# Patient Record
Sex: Male | Born: 2000 | Race: White | Hispanic: No | Marital: Single | Smoking: Never smoker
Health system: Southern US, Community
[De-identification: ages and names within clinical notes are randomized; demographics above are authoritative.]

## PROBLEM LIST (undated history)

## (undated) DIAGNOSIS — E119 Type 2 diabetes mellitus without complications: Secondary | ICD-10-CM

---
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
## 2021-11-18 ENCOUNTER — Ambulatory Visit
Admission: EM | Admit: 2021-11-18 | Discharge: 2021-11-18 | Disposition: A | Discharge status: Home / Self Care | Payer: Medicaid Other

## 2021-11-18 ENCOUNTER — Other Ambulatory Visit: Payer: Self-pay

## 2021-11-18 ENCOUNTER — Encounter: Payer: Self-pay | Admitting: Emergency Medicine

## 2021-11-18 DIAGNOSIS — S61411A Laceration without foreign body of right hand, initial encounter: Secondary | ICD-10-CM | POA: Diagnosis not present

## 2021-11-18 DIAGNOSIS — Z23 Encounter for immunization: Secondary | ICD-10-CM

## 2021-11-18 HISTORY — DX: Type 2 diabetes mellitus without complications: E11.9

## 2021-11-18 MED ORDER — TETANUS-DIPHTH-ACELL PERTUSSIS 5-2.5-18.5 LF-MCG/0.5 IM SUSY
0.5000 mL | PREFILLED_SYRINGE | Freq: Once | INTRAMUSCULAR | Status: AC
  Administered 2021-11-18: 0.5 mL via INTRAMUSCULAR

## 2021-11-18 NOTE — Discharge Instructions (Addendum)
Your laceration has been applied to close your laceration.  Please monitor for signs of infection that include increased redness, swelling, purulent drainage.  Tetanus vaccine has also been updated today.  Return if symptoms worsen or persist.

## 2021-11-18 NOTE — ED Triage Notes (Signed)
Patient states that he stabbed himself with an oyster shucker last night at work around 7:30pm.  Patient is unsure of last Tdap.

## 2021-11-18 NOTE — ED Provider Notes (Signed)
[facility]    CSN: [id number] Arrival date & time: 11/18/21  1430      History   Chief Complaint Chief Complaint  Patient presents with   Laceration    HPI Samuel Perez is a 21 y.o. male.   Patient presents for laceration to palmar surface of left hand between the first and second digit.  Patient reports that he stabbed himself with an oyster shucker last night at work at approximately 7 PM.  Patient is not sure of tetanus vaccine.  Denies any numbness or tingling.  Has full range of motion of fingers and hands.   Laceration  Past Medical History:  Diagnosis Date   Diabetes mellitus without complication (HCC)     There are no problems to display for this patient.   History reviewed. No pertinent surgical history.     Home Medications    Prior to Admission medications   Medication Sig Start Date End Date Taking? Authorizing Provider  Continuous Blood Gluc Receiver (DEXCOM G6 RECEIVER) DEVI Use as directed to monitor BGs. Brand medically necessary. 08/15/21  Yes [provider]  Continuous Blood Gluc Sensor (DEXCOM G6 SENSOR) MISC Use as directed to monitor BGs. Change ever 10 days. Brand medically necessary. 08/15/21  Yes [provider]  Continuous Blood Gluc Transmit (DEXCOM G6 TRANSMITTER) MISC Use as directed to monitor BG. Change every 3 months. Brand medically necessary. 08/15/21  Yes [provider]  insulin aspart (NOVOLOG) 100 UNIT/ML FlexPen INJECT SUBCUTANEOUSLY AS DIRECTED UP TO MAX DAILY DOSE OF 100 UNITS 11/13/20  Yes [provider]    Family History Family History  Problem Relation Age of Onset   Healthy Mother    Healthy Father     Social History Social History   Tobacco Use   Smoking status: Never   Smokeless tobacco: Never  Substance Use Topics   Alcohol use: Never   Drug use: Never     Allergies   Azithromycin and Penicillins   Review of Systems Review of Systems Per HPI  Physical  Exam Triage Vital Signs ED Triage Vitals  Enc Vitals Group     BP 11/18/21 1524 120/69     Pulse Rate 11/18/21 1524 62     Resp 11/18/21 1524 18     Temp 11/18/21 1524 98.5 F (36.9 C)     Temp Source 11/18/21 1524 Oral     SpO2 11/18/21 1524 96 %     Weight 11/18/21 1526 190 lb (86.2 kg)     Height 11/18/21 1526 5\' 9"  (1.753 m)     Head Circumference --      Peak Flow --      Pain Score 11/18/21 1526 1     Pain Loc --      Pain Edu? --      Excl. in GC? --    No data found.  Updated Vital Signs BP 120/69 (BP Location: Right Arm)    Pulse 62    Temp 98.5 F (36.9 C) (Oral)    Resp 18    Ht 5\' 9"  (1.753 m)    Wt 190 lb (86.2 kg)    SpO2 96%    BMI 28.06 kg/m   Visual Acuity Right Eye Distance:   Left Eye Distance:   Bilateral Distance:    Right Eye Near:   Left Eye Near:    Bilateral Near:     Physical Exam Constitutional:      General: He is not  in acute distress.    Appearance: Normal appearance. He is not toxic-appearing or diaphoretic.  HENT:     Head: Normocephalic and atraumatic.  Eyes:     Extraocular Movements: Extraocular movements intact.     Conjunctiva/sclera: Conjunctivae normal.  Pulmonary:     Effort: Pulmonary effort is normal.  Skin:    Findings: Laceration present.     Comments: Approximately 0.5 mm superficial laceration present to palmar surface of right hand between first and second digit.  Bleeding controlled.  Patient has full range of motion of all fingers.  Neurovascular intact.  Neurological:     General: No focal deficit present.     Mental Status: He is alert and oriented to person, place, and time. Mental status is at baseline.  Psychiatric:        Mood and Affect: Mood normal.        Behavior: Behavior normal.        Thought Content: Thought content normal.        Judgment: Judgment normal.     UC Treatments / Results  Labs (all labs ordered are listed, but only abnormal results are displayed) Labs Reviewed - No data to  display  EKG   Radiology No results found.  Procedures Laceration Repair  Date/Time: 11/18/2021 4:02 PM Performed by: Gustavus Bryant, FNP Authorized by: Gustavus Bryant, FNP   Consent:    Consent obtained:  Verbal   Consent given by:  Patient   Risks, benefits, and alternatives were discussed: yes     Risks discussed:  Infection and pain   Alternatives discussed:  No treatment Universal protocol:    Procedure explained and questions answered to patient or proxy's satisfaction: yes     Site/side marked: yes     Immediately prior to procedure, a time out was called: yes     Patient identity confirmed:  Verbally with patient and arm band Anesthesia:    Anesthesia method:  None Laceration details:    Location:  Hand   Hand location:  R palm   Length (cm):  0.5 Exploration:    Imaging outcome: foreign body not noted     Wound exploration: wound explored through full range of motion and entire depth of wound visualized     Contaminated: no   Treatment:    Area cleansed with:  Chlorhexidine   Amount of cleaning:  Standard   Debridement:  None Skin repair:    Repair method:  Tissue adhesive Approximation:    Approximation:  Close Repair type:    Repair type:  Simple Post-procedure details:    Dressing:  Open (no dressing)   Procedure completion:  Tolerated well, no immediate complications (including critical care time)  Medications Ordered in UC Medications  Tdap (BOOSTRIX) injection 0.5 mL (0.5 mLs Intramuscular Given 11/18/21 1548)    Initial Impression / Assessment and Plan / UC Course  I have reviewed the triage vital signs and the nursing notes.  Pertinent labs & imaging results that were available during my care of the patient were reviewed by me and considered in my medical decision making (see chart for details).     Laceration closed with Dermabond given timing of wound and sutures not being used for closing wound.  Discussed risk of using Dermabond given  location of wound.  Patient voiced understanding.  No signs of infection presently.  Tetanus updated today.  Patient to monitor for signs of infection.  Discussed return precautions.  Patient verbalized understanding  and was agreeable with plan. Final Clinical Impressions(s) / UC Diagnoses   Final diagnoses:  Laceration of right hand without foreign body, initial encounter     Discharge Instructions      Your laceration has been applied to close your laceration.  Please monitor for signs of infection that include increased redness, swelling, purulent drainage.  Tetanus vaccine has also been updated today.  Return if symptoms worsen or persist.    ED Prescriptions   None    PDMP not reviewed this encounter.   Gustavus Bryant, [place] 11/18/21 [phone]

## 2021-12-18 ENCOUNTER — Encounter (HOSPITAL_COMMUNITY): Payer: Self-pay

## 2021-12-18 ENCOUNTER — Other Ambulatory Visit: Payer: Self-pay

## 2021-12-18 ENCOUNTER — Encounter: Payer: Self-pay | Admitting: Emergency Medicine

## 2021-12-18 ENCOUNTER — Ambulatory Visit
Admission: EM | Admit: 2021-12-18 | Discharge: 2021-12-18 | Disposition: A | Discharge status: Home / Self Care | Payer: Medicaid Other | Attending: Internal Medicine | Admitting: Internal Medicine

## 2021-12-18 ENCOUNTER — Observation Stay (HOSPITAL_COMMUNITY)
Admission: EM | Admit: 2021-12-18 | Discharge: 2021-12-19 | Disposition: A | Discharge status: Home / Self Care | Payer: Medicaid Other | Attending: Family Medicine | Admitting: Family Medicine

## 2021-12-18 ENCOUNTER — Emergency Department (HOSPITAL_COMMUNITY): Payer: Medicaid Other

## 2021-12-18 DIAGNOSIS — Z794 Long term (current) use of insulin: Secondary | ICD-10-CM | POA: Diagnosis not present

## 2021-12-18 DIAGNOSIS — E1010 Type 1 diabetes mellitus with ketoacidosis without coma: Principal | ICD-10-CM | POA: Insufficient documentation

## 2021-12-18 DIAGNOSIS — R112 Nausea with vomiting, unspecified: Secondary | ICD-10-CM | POA: Diagnosis present

## 2021-12-18 DIAGNOSIS — R079 Chest pain, unspecified: Secondary | ICD-10-CM | POA: Diagnosis not present

## 2021-12-18 LAB — GLUCOSE, CAPILLARY
Glucose-Capillary: 457 mg/dL — ABNORMAL HIGH (ref 70–99)
Glucose-Capillary: 352 mg/dL — ABNORMAL HIGH (ref 70–99)
Glucose-Capillary: 288 mg/dL — ABNORMAL HIGH (ref 70–99)
Glucose-Capillary: 256 mg/dL — ABNORMAL HIGH (ref 70–99)
Glucose-Capillary: 207 mg/dL — ABNORMAL HIGH (ref 70–99)
Glucose-Capillary: 226 mg/dL — ABNORMAL HIGH (ref 70–99)
Glucose-Capillary: 187 mg/dL — ABNORMAL HIGH (ref 70–99)

## 2021-12-18 LAB — HEPATIC FUNCTION PANEL
ALT: 41 U/L (ref 0–44)
AST: 44 U/L — ABNORMAL HIGH (ref 15–41)
Albumin: 4.9 g/dL (ref 3.5–5.0)
Alkaline Phosphatase: 62 U/L (ref 38–126)
Bilirubin, Direct: 0.2 mg/dL (ref 0.0–0.2)
Indirect Bilirubin: 1 mg/dL — ABNORMAL HIGH (ref 0.3–0.9)
Total Bilirubin: 1.2 mg/dL (ref 0.3–1.2)
Total Protein: 8.4 g/dL — ABNORMAL HIGH (ref 6.5–8.1)

## 2021-12-18 LAB — BASIC METABOLIC PANEL
Anion gap: 21 — ABNORMAL HIGH (ref 5–15)
Anion gap: 12 (ref 5–15)
Anion gap: 6 (ref 5–15)
BUN: 26 mg/dL — ABNORMAL HIGH (ref 6–20)
BUN: 24 mg/dL — ABNORMAL HIGH (ref 6–20)
BUN: 21 mg/dL — ABNORMAL HIGH (ref 6–20)
CO2: 19 mmol/L — ABNORMAL LOW (ref 22–32)
CO2: 22 mmol/L (ref 22–32)
CO2: 24 mmol/L (ref 22–32)
Calcium: 9.9 mg/dL (ref 8.9–10.3)
Calcium: 9.6 mg/dL (ref 8.9–10.3)
Calcium: 9.1 mg/dL (ref 8.9–10.3)
Chloride: 94 mmol/L — ABNORMAL LOW (ref 98–111)
Chloride: 102 mmol/L (ref 98–111)
Chloride: 104 mmol/L (ref 98–111)
Creatinine, Ser: 1.08 mg/dL (ref 0.61–1.24)
Creatinine, Ser: 1.08 mg/dL (ref 0.61–1.24)
Creatinine, Ser: 0.72 mg/dL (ref 0.61–1.24)
GFR, Estimated: >60 mL/min (ref >60)
GFR, Estimated: >60 mL/min (ref >60)
GFR, Estimated: >60 mL/min (ref >60)
Glucose, Bld: 476 mg/dL — ABNORMAL HIGH (ref 70–99)
Glucose, Bld: 259 mg/dL — ABNORMAL HIGH (ref 70–99)
Glucose, Bld: 212 mg/dL — ABNORMAL HIGH (ref 70–99)
Potassium: 5.1 mmol/L (ref 3.5–5.1)
Potassium: 4.3 mmol/L (ref 3.5–5.1)
Potassium: 4.2 mmol/L (ref 3.5–5.1)
Sodium: 134 mmol/L — ABNORMAL LOW (ref 135–145)
Sodium: 136 mmol/L (ref 135–145)
Sodium: 134 mmol/L — ABNORMAL LOW (ref 135–145)

## 2021-12-18 LAB — CBC
HCT: 47.8 % (ref 39.0–52.0)
Hemoglobin: 15.5 g/dL (ref 13.0–17.0)
MCH: 28.1 pg (ref 26.0–34.0)
MCHC: 32.4 g/dL (ref 30.0–36.0)
MCV: 86.6 fL (ref 80.0–100.0)
Platelets: 176 10*3/uL (ref 150–400)
RBC: 5.52 MIL/uL (ref 4.22–5.81)
RDW: 13.2 % (ref 11.5–15.5)
WBC: 16.3 10*3/uL — ABNORMAL HIGH (ref 4.0–10.5)
nRBC: 0 % (ref 0.0–0.2)

## 2021-12-18 LAB — BLOOD GAS, VENOUS
Acid-base deficit: 13.7 mmol/L — ABNORMAL HIGH (ref 0.0–2.0)
Bicarbonate: 13.3 mmol/L — ABNORMAL LOW (ref 20.0–28.0)
O2 Saturation: 93.1 %
Patient temperature: 37
pCO2, Ven: 34 mmHg — ABNORMAL LOW (ref 44–60)
pH, Ven: 7.2 — ABNORMAL LOW (ref 7.25–7.43)
pO2, Ven: 72 mmHg — ABNORMAL HIGH (ref 32–45)

## 2021-12-18 LAB — TSH: TSH: 0.593 u[IU]/mL (ref 0.350–4.500)

## 2021-12-18 LAB — URINALYSIS, ROUTINE W REFLEX MICROSCOPIC
Bilirubin Urine: NEGATIVE
Glucose, UA: >=500 mg/dL — AB
Hgb urine dipstick: NEGATIVE
Ketones, ur: 80 mg/dL — AB
Leukocytes,Ua: NEGATIVE
Nitrite: NEGATIVE
Protein, ur: NEGATIVE mg/dL
Specific Gravity, Urine: 1.025 (ref 1.005–1.030)
pH: 5 (ref 5.0–8.0)

## 2021-12-18 LAB — BETA-HYDROXYBUTYRIC ACID
Beta-Hydroxybutyric Acid: 3.23 mmol/L — ABNORMAL HIGH (ref 0.05–0.27)
Beta-Hydroxybutyric Acid: 0.09 mmol/L (ref 0.05–0.27)

## 2021-12-18 LAB — MRSA NEXT GEN BY PCR, NASAL: MRSA by PCR Next Gen: NOT DETECTED

## 2021-12-18 LAB — LIPASE, BLOOD: Lipase: 25 U/L (ref 11–51)

## 2021-12-18 LAB — CBG MONITORING, ED
Glucose-Capillary: 367 mg/dL — ABNORMAL HIGH (ref 70–99)
Glucose-Capillary: 507 mg/dL (ref 70–99)

## 2021-12-18 LAB — HIV ANTIBODY (ROUTINE TESTING W REFLEX): HIV Screen 4th Generation wRfx: NONREACTIVE

## 2021-12-18 MED ORDER — OXYCODONE HCL 5 MG PO TABS: 5.0000 mg | ORAL_TABLET | ORAL | Status: DC | PRN

## 2021-12-18 MED ORDER — LACTATED RINGERS IV BOLUS
1000.0000 mL | INTRAVENOUS | Status: AC
  Administered 2021-12-18: 1000 mL via INTRAVENOUS

## 2021-12-18 MED ORDER — LACTATED RINGERS IV SOLN: INTRAVENOUS | Status: DC

## 2021-12-18 MED ORDER — CHLORHEXIDINE GLUCONATE CLOTH 2 % EX PADS
6.0000 | MEDICATED_PAD | Freq: Every day | CUTANEOUS | Status: DC
  Administered 2021-12-18: 6 via TOPICAL

## 2021-12-18 MED ORDER — ACETAMINOPHEN 325 MG PO TABS: 650.0000 mg | ORAL_TABLET | Freq: Four times a day (QID) | ORAL | Status: DC | PRN

## 2021-12-18 MED ORDER — ONDANSETRON HCL 4 MG/2ML IJ SOLN: 4.0000 mg | Freq: Four times a day (QID) | INTRAMUSCULAR | Status: DC | PRN

## 2021-12-18 MED ORDER — DEXTROSE IN LACTATED RINGERS 5 % IV SOLN: INTRAVENOUS | Status: DC

## 2021-12-18 MED ORDER — METOCLOPRAMIDE HCL 5 MG/ML IJ SOLN
10.0000 mg | Freq: Once | INTRAMUSCULAR | Status: AC
Start: 2021-12-18 — End: 2021-12-18
  Administered 2021-12-18: 10 mg via INTRAVENOUS
  Filled 2021-12-18: qty 2

## 2021-12-18 MED ORDER — ACETAMINOPHEN 650 MG RE SUPP: 650.0000 mg | Freq: Four times a day (QID) | RECTAL | Status: DC | PRN

## 2021-12-18 MED ORDER — ONDANSETRON HCL 4 MG PO TABS: 4.0000 mg | ORAL_TABLET | Freq: Four times a day (QID) | ORAL | Status: DC | PRN

## 2021-12-18 MED ORDER — DEXTROSE 50 % IV SOLN: 0.0000 mL | INTRAVENOUS | Status: DC | PRN

## 2021-12-18 MED ORDER — INSULIN REGULAR(HUMAN) IN NACL 100-0.9 UT/100ML-% IV SOLN
INTRAVENOUS | Status: DC
  Administered 2021-12-18: 13 [IU]/h via INTRAVENOUS
  Filled 2021-12-18: qty 100

## 2021-12-18 MED ORDER — LACTATED RINGERS IV BOLUS
1000.0000 mL | Freq: Once | INTRAVENOUS | Status: AC
  Administered 2021-12-18: 1000 mL via INTRAVENOUS

## 2021-12-18 MED ORDER — ENOXAPARIN SODIUM 40 MG/0.4ML IJ SOSY
40.0000 mg | PREFILLED_SYRINGE | INTRAMUSCULAR | Status: DC
  Filled 2021-12-18: qty 0.4

## 2021-12-18 MED ORDER — ORAL CARE MOUTH RINSE
15.0000 mL | Freq: Two times a day (BID) | OROMUCOSAL | Status: DC
  Administered 2021-12-19: 15 mL via OROMUCOSAL

## 2021-12-18 MED ORDER — DIPHENHYDRAMINE HCL 50 MG/ML IJ SOLN
25.0000 mg | Freq: Once | INTRAMUSCULAR | Status: AC
Start: 2021-12-18 — End: 2021-12-18
  Administered 2021-12-18: 25 mg via INTRAVENOUS
  Filled 2021-12-18: qty 1

## 2021-12-18 MED ORDER — ALUM & MAG HYDROXIDE-SIMETH 200-200-20 MG/5ML PO SUSP
30.0000 mL | Freq: Once | ORAL | Status: AC
Start: 2021-12-18 — End: 2021-12-18
  Administered 2021-12-18: 30 mL via ORAL
  Filled 2021-12-18: qty 30

## 2021-12-18 NOTE — Hospital Course (Signed)
Samuel Perez is a 21 y.o. M with T1DM on insulin pump who presented with 1 day vomiting, malaise, and epigastric pain. ? ?Yesterday when he replaced his insulin pump, he noticed the "needle looked bent".  Then overnight, he started to have recurrent vomiting, associated with new severe epigastric and chest pain, dry mouth and malaise.  He had no prior urinary irritative symptomes, fever/chils, cough, congestion, sore throat or other constitutional symptoms. ? ?In the ER, Bicarb 19, AG 21, Glucose 476.  UA with glucose and ketones.  CXR clear.   ? ? ? ?3/14: Started on IV fluids, insulin and admitted  ?

## 2021-12-18 NOTE — ED Notes (Signed)
Lab notified of add on labs.

## 2021-12-18 NOTE — Progress Notes (Signed)
Inpatient Diabetes Program Recommendations ? ?AACE/ADA: New Consensus Statement on Inpatient Glycemic Control (2015) ? ?Target Ranges:  Prepandial:   less than 140 mg/dL ?     Peak postprandial:   less than 180 mg/dL (1-2 hours) ?     Critically ill patients:  140 - 180 mg/dL  ? ?Lab Results  ?Component Value Date  ? GLUCAP 457 (H) 12/18/2021  ? ? ?Review of Glycemic Control ? ?Diabetes history: DM1 ?Outpatient Diabetes medications: Tandum insulin pump ?Current orders for Inpatient glycemic control: IV insulin per EndoTool for DKA ? ?In case of pump failure, Lantus 39 units QD ?Insulin pump: ?Basal: ?Bolus: ?Insulin/CHO ratio: 1:7 ?CF - 25 with target of 125 ? ?Inpatient Diabetes Program Recommendations   ? ?Continue with IV insulin until criteria met for discontinuation of drip. ? ?Will see pt on 3/15 to discuss his diabetes control. ? ?Continue to follow.  ? ?Thank you. ?Lorenda Peck, RD, LDN, CDE ?Inpatient Diabetes Coordinator ?[phone]  ? ? ? ? ?

## 2021-12-18 NOTE — ED Triage Notes (Signed)
Vomited twice this morning, after second time experienced severe chest pain and epigastric pain, constant, aching/burning in nature, 7/10. Restless in triage, type 1 DM. High CBG x 6 hours ?

## 2021-12-18 NOTE — ED Provider Notes (Signed)
?[facility] DEPT ?Provider Note ? ? ?CSN: [id number] ?Arrival date & time: 12/18/21  1109 ? ?  ? ?History ? ?Chief Complaint  ?Patient presents with  ? Emesis  ? Chest Pain  ? Hyperglycemia  ? ? ?Samuel Perez is a 21 y.o. male. ? ?21 yo M with a cc of nausea and vomiting.  Has had 3-4 events since this morning.  He had 1 episode of emesis and felt like his chest hurt a bit.  Another episode of emesis when arrival here to the ED.  Denies fevers or chills.  He had some cottage cheese in the middle the night which she thinks maybe made him sick. ? ? ?Emesis ?Chest Pain ?Associated symptoms: vomiting   ?Hyperglycemia ?Associated symptoms: chest pain and vomiting   ? ?  ? ?Home Medications ?Prior to Admission medications   ?Medication Sig Start Date End Date Taking? Authorizing Provider  ?GLUCAGON HCL IJ Inject 1 Dose as directed as needed (hypoglycemia).   Yes [provider]  ?insulin aspart (NOVOLOG) 100 UNIT/ML FlexPen Inject 100 Units into the skin See admin instructions. Uses in insulin pump, 100 units per day max daily dose. 11/13/20  Yes [provider]  ?Multiple Vitamin (MULTIVITAMIN) tablet Take 1 tablet by mouth daily.   Yes [provider]  ?   ? ?Allergies    ?Azithromycin and Penicillins   ? ?Review of Systems   ?Review of Systems  ?Cardiovascular:  Positive for chest pain.  ?Gastrointestinal:  Positive for vomiting.  ? ?Physical Exam ?Updated Vital Signs ?BP 139/68   Pulse 75   Temp (!) 97.5 ?F (36.4 ?C) (Oral)   Resp (!) 24   Ht 5\' 9"  (1.753 m)   Wt 86.2 kg   SpO2 100%   BMI 28.06 kg/m?  ?Physical Exam ?Vitals and nursing note reviewed.  ?Constitutional:   ?   Appearance: He is well-developed.  ?HENT:  ?   Head: Normocephalic and atraumatic.  ?Eyes:  ?   Pupils: Pupils are equal, round, and reactive to light.  ?Neck:  ?   Vascular: No JVD.  ?Cardiovascular:  ?   Rate and Rhythm: Normal rate and regular rhythm.  ?   Heart sounds: No murmur heard. ?   No friction rub. No gallop.  ?Pulmonary:  ?   Effort: No respiratory distress.  ?   Breath sounds: No wheezing.  ?Abdominal:  ?   General: There is no distension.  ?   Tenderness: There is no abdominal tenderness. There is no guarding or rebound.  ?Musculoskeletal:     ?   General: Normal range of motion.  ?   Cervical back: Normal range of motion and neck supple.  ?Skin: ?   Coloration: Skin is not pale.  ?   Findings: No rash.  ?Neurological:  ?   Mental Status: He is alert and oriented to person, place, and time.  ?Psychiatric:     ?   Behavior: Behavior normal.  ? ? ?ED Results / Procedures / Treatments   ?Labs ?(all labs ordered are listed, but only abnormal results are displayed) ?Labs Reviewed  ?CBC - Abnormal; Notable for the following components:  ?    Result Value  ? WBC 16.3 (*)   ? All other components within normal limits  ?URINALYSIS, ROUTINE W REFLEX MICROSCOPIC - Abnormal; Notable for the following components:  ? Color, Urine STRAW (*)   ? Glucose, UA >=500 (*)   ? Ketones, ur 80 (*)   ?  Bacteria, UA RARE (*)   ? All other components within normal limits  ?HEPATIC FUNCTION PANEL - Abnormal; Notable for the following components:  ? Total Protein 8.4 (*)   ? AST 44 (*)   ? Indirect Bilirubin 1.0 (*)   ? All other components within normal limits  ?BASIC METABOLIC PANEL - Abnormal; Notable for the following components:  ? Sodium 134 (*)   ? Chloride 94 (*)   ? CO2 19 (*)   ? Glucose, Bld 476 (*)   ? BUN 26 (*)   ? Anion gap 21 (*)   ? All other components within normal limits  ?CBG MONITORING, ED - Abnormal; Notable for the following components:  ? Glucose-Capillary 367 (*)   ? All other components within normal limits  ?LIPASE, BLOOD  ?BASIC METABOLIC PANEL  ?BASIC METABOLIC PANEL  ?BASIC METABOLIC PANEL  ?BETA-HYDROXYBUTYRIC ACID  ?BETA-HYDROXYBUTYRIC ACID  ?BLOOD GAS, VENOUS  ?CBG MONITORING, ED  ?CBG MONITORING, ED  ? ? ?EKG ?None ? ?Radiology ?DG Chest Port 1 View ? ?Result Date:  12/18/2021 ?CLINICAL DATA:  Chest and epigastric pain EXAM: PORTABLE CHEST 1 VIEW COMPARISON:  None. FINDINGS: Normal heart size. Normal mediastinal contour. No pneumothorax. No pleural effusion. Lungs appear clear, with no acute consolidative airspace disease and no pulmonary edema. IMPRESSION: No active disease. Electronically Signed   By: Ilona Sorrel M.D.   On: 12/18/2021 11:54   ? ?Procedures ?Procedures  ? ? ?Medications Ordered in ED ?Medications  ?lactated ringers bolus 1,000 mL (has no administration in time range)  ?insulin regular, human (MYXREDLIN) 100 units/ 100 mL infusion (has no administration in time range)  ?lactated ringers infusion (has no administration in time range)  ?dextrose 5 % in lactated ringers infusion (has no administration in time range)  ?dextrose 50 % solution 0-50 mL (has no administration in time range)  ?lactated ringers bolus 1,000 mL (1,000 mLs Intravenous New Bag/Given 12/18/21 1203)  ?diphenhydrAMINE (BENADRYL) injection 25 mg (25 mg Intravenous Given 12/18/21 1204)  ?metoCLOPramide (REGLAN) injection 10 mg (10 mg Intravenous Given 12/18/21 1204)  ?alum & mag hydroxide-simeth (MAALOX/MYLANTA) 200-200-20 MG/5ML suspension 30 mL (30 mLs Oral Given 12/18/21 1246)  ? ? ?ED Course/ Medical Decision Making/ A&P ?  ?                        ?Medical Decision Making ?Amount and/or Complexity of Data Reviewed ?Labs: ordered. ?Radiology: ordered. ? ?Risk ?OTC drugs. ?Prescription drug management. ? ? ?21 yo M with a chief complaint of nausea and vomiting.  This started last night.  He typically has very tightly controlled type I diabetic but felt like he has been having some trouble controlling his blood sugars for the past few days.  He did eat some cottage cheese last night.  He denied it being expired or left out for period of time.  We will obtain a laboratory evaluation and treat nausea reassess. ? ? ?Lab work concerning for DKA.  Mild leukocytosis.  Will start on insulin infusion.   Discussed with medicine for admission. ? ?CRITICAL CARE ?Performed by: Cecilio Asper ? ? ?Total critical care time: 35 minutes ? ?Critical care time was exclusive of separately billable procedures and treating other patients. ? ?Critical care was necessary to treat or prevent imminent or life-threatening deterioration. ? ?Critical care was time spent personally by me on the following activities: development of treatment plan with patient and/or surrogate as well as nursing, discussions  with consultants, evaluation of patient's response to treatment, examination of patient, obtaining history from patient or surrogate, ordering and performing treatments and interventions, ordering and review of laboratory studies, ordering and review of radiographic studies, pulse oximetry and re-evaluation of patient's condition. ? ?The patients results and plan were reviewed and discussed.   ?Any x-rays performed were independently reviewed by myself.  ? ?Differential diagnosis were considered with the presenting HPI. ? ?Medications  ?lactated ringers bolus 1,000 mL (has no administration in time range)  ?insulin regular, human (MYXREDLIN) 100 units/ 100 mL infusion (has no administration in time range)  ?lactated ringers infusion (has no administration in time range)  ?dextrose 5 % in lactated ringers infusion (has no administration in time range)  ?dextrose 50 % solution 0-50 mL (has no administration in time range)  ?lactated ringers bolus 1,000 mL (1,000 mLs Intravenous New Bag/Given 12/18/21 1203)  ?diphenhydrAMINE (BENADRYL) injection 25 mg (25 mg Intravenous Given 12/18/21 1204)  ?metoCLOPramide (REGLAN) injection 10 mg (10 mg Intravenous Given 12/18/21 1204)  ?alum & mag hydroxide-simeth (MAALOX/MYLANTA) 200-200-20 MG/5ML suspension 30 mL (30 mLs Oral Given 12/18/21 1246)  ? ? ?Vitals:  ? 12/18/21 1119 12/18/21 1122 12/18/21 1245 12/18/21 1300  ?BP: (!) 111/46  129/72 139/68  ?Pulse: 63  97 75  ?Resp: 14  (!) 22 (!) 24   ?Temp: (!) 97.5 ?F (36.4 ?C)     ?TempSrc: Oral     ?SpO2: 99%  93% 100%  ?Weight:  86.2 kg    ?Height:  5\' 9"  (1.753 m)    ? ? ?Final diagnoses:  ?Diabetic ketoacidosis without coma associated with type 1 diabetes mellitus ([place]

## 2021-12-18 NOTE — Assessment & Plan Note (Signed)
-   Stop insulin pump ?- Give additional 1L IVF ?- Start insulin infusion ?- BMP q4hrs ?- Check BHOB ?- Continue IV fluids ?- Check TSH and A1c  ? ? ? ?

## 2021-12-18 NOTE — ED Triage Notes (Signed)
Patient went to [facility] and was sent to the ED. ? ?Patient c/o chest pain/epigastric pain, hyperglycemia x 6 hours. Patient is on a Tandem insulin pump and has a Dexcom. ?CBG in triage- 367. ?Patient is having abdominal pain, N/v that started this AM. ? ? ?

## 2021-12-18 NOTE — ED Notes (Signed)
Upon entering Pt room with visitor. Pt was kneeled down on floor vomiting. ?

## 2021-12-18 NOTE — ED Provider Notes (Signed)
?[facility] ? ? ? ?CSN: [id number] ?Arrival date & time: 12/18/21  1026 ? ? ?  ? ?History   ?Chief Complaint ?Chief Complaint  ?Patient presents with  ? Chest Pain  ? ? ?HPI ?Samuel Perez is a 21 y.o. male.  ? ?Patient here today for evaluation of nausea, vomiting, chest pain and epigastric pain that started this morning.  He reports that pain is aching and burning in nature and he rates it a 7 out of 10 on 0-10 pain Scale.  He states that his blood sugars have been elevated the last 6 hours.  He does not report any fever.  He does have history of type 1 diabetes. ? ?The history is provided by the patient.  ?Chest Pain ?Associated symptoms: nausea and vomiting   ?Associated symptoms: no fever and no shortness of breath   ? ?Past Medical History:  ?Diagnosis Date  ? Diabetes mellitus without complication (HCC)   ? ? ?There are no problems to display for this patient. ? ? ?History reviewed. No pertinent surgical history. ? ? ? ? ?Home Medications   ? ?Prior to Admission medications   ?Medication Sig Start Date End Date Taking? Authorizing Provider  ?Continuous Blood Gluc Receiver (DEXCOM G6 RECEIVER) DEVI Use as directed to monitor BGs. Brand medically necessary. 08/15/21   [provider]  ?Continuous Blood Gluc Sensor (DEXCOM G6 SENSOR) MISC Use as directed to monitor BGs. Change ever 10 days. Brand medically necessary. 08/15/21   [provider]  ?Continuous Blood Gluc Transmit (DEXCOM G6 TRANSMITTER) MISC Use as directed to monitor BG. Change every 3 months. Brand medically necessary. 08/15/21   [provider]  ?insulin aspart (NOVOLOG) 100 UNIT/ML FlexPen INJECT SUBCUTANEOUSLY AS DIRECTED UP TO MAX DAILY DOSE OF 100 UNITS 11/13/20   [provider]  ? ? ?Family History ?Family History  ?Problem Relation Age of Onset  ? Healthy Mother   ? Healthy Father   ? ? ?Social History ?Social History  ? ?Tobacco Use  ? Smoking status: Never  ? Smokeless tobacco: Never  ?Substance Use  Topics  ? Alcohol use: Never  ? Drug use: Never  ? ? ? ?Allergies   ?Azithromycin and Penicillins ? ? ?Review of Systems ?Review of Systems  ?Constitutional:  Negative for chills and fever.  ?Eyes:  Negative for discharge and redness.  ?Respiratory:  Negative for shortness of breath.   ?Cardiovascular:  Positive for chest pain.  ?Gastrointestinal:  Positive for nausea and vomiting.  ? ? ?Physical Exam ?Triage Vital Signs ?ED Triage Vitals [12/18/21 1035]  ?Enc Vitals Group  ?   BP 127/70  ?   Pulse Rate (!) 55  ?   Resp (!) 24  ?   Temp 97.8 ?F (36.6 ?C)  ?   Temp Source Oral  ?   SpO2 99 %  ?   Weight   ?   Height   ?   Head Circumference   ?   Peak Flow   ?   Pain Score 7  ?   Pain Loc   ?   Pain Edu?   ?   Excl. in GC?   ? ?No data found. ? ?Updated Vital Signs ?BP 127/70 (BP Location: Right Arm)   Pulse (!) 55   Temp 97.8 ?F (36.6 ?C) (Oral)   Resp (!) 24   SpO2 99%  ?   ? ?Physical Exam ?Vitals and nursing note reviewed.  ?Constitutional:   ?  Appearance: Normal appearance. He is ill-appearing (appears pale, restless).  ?HENT:  ?   Head: Normocephalic and atraumatic.  ?Eyes:  ?   Conjunctiva/sclera: Conjunctivae normal.  ?Cardiovascular:  ?   Rate and Rhythm: Normal rate.  ?Pulmonary:  ?   Effort: Pulmonary effort is normal.  ?Neurological:  ?   Mental Status: He is alert.  ?Psychiatric:     ?   Mood and Affect: Mood normal.     ?   Behavior: Behavior normal.     ?   Thought Content: Thought content normal.  ? ? ? ?UC Treatments / Results  ?Labs ?(all labs ordered are listed, but only abnormal results are displayed) ?Labs Reviewed - No data to display ? ?EKG ? ? ?Radiology ?No results found. ? ?Procedures ?Procedures (including critical care time) ? ?Medications Ordered in UC ?Medications - No data to display ? ?Initial Impression / Assessment and Plan / UC Course  ?I have reviewed the triage vital signs and the nursing notes. ? ?Pertinent labs & imaging results that were available during my care of the  patient were reviewed by me and considered in my medical decision making (see chart for details). ? ?  ?Given past medical history and blood glucose today in office of 377 recommended further evaluation in the emergency room for stat labs and possibly imaging.  Patient is agreeable to same.  Father drove him to office today and will drive him POV to emergency room. ? ?Final Clinical Impressions(s) / UC Diagnoses  ? ?Final diagnoses:  ?Chest pain, unspecified type  ? ? ? ?Discharge Instructions   ? ?  ? ?Please report to ED at [facility] ? ?[address] ?[place], [place] ? ? ? ? ? ?ED Prescriptions   ?None ?  ? ?PDMP not reviewed this encounter. ?  ?Tomi Bamberger, PA-C ?12/18/21 1115 ? ?

## 2021-12-18 NOTE — H&P (Signed)
?History and Physical  ? ? ?Patient: Samuel Perez JXB:[id number] DOB: April 13, 2001 ?DOA: 12/18/2021 ?DOS: the patient was seen and examined on 12/18/2021 ?PCP: Lance Bosch, NP  ?Patient coming from: Home ? ? ? ? ? ?Chief Complaint:  ?Chief Complaint  ?Patient presents with  ? Emesis  ? Chest Pain  ? Hyperglycemia  ? ?HPI:  ?Mr. Want is a 21 y.o. M with T1DM on insulin pump who presented with 1 day vomiting, malaise, and epigastric pain. ? ?Yesterday when he replaced his insulin pump, he noticed the "needle looked bent".  Then overnight, he started to have recurrent vomiting, associated with new severe epigastric and chest pain, dry mouth and malaise.  He had no prior urinary irritative symptomes, fever/chils, cough, congestion, sore throat or other constitutional symptoms. ? ?In the ER, Bicarb 19, AG 21, Glucose 476.  UA with glucose and ketones.  CXR clear.   ? ? ? ?3/14: Started on IV fluids, insulin and admitted    ? ? ? ?Review of Systems: Review of Systems  ?Constitutional:  Positive for malaise/fatigue. Negative for chills and fever.  ?HENT:  Negative for congestion, sinus pain and sore throat.   ?Respiratory:  Negative for cough.   ?Cardiovascular:  Positive for chest pain.  ?Gastrointestinal:  Positive for abdominal pain, nausea and vomiting.  ?Genitourinary:  Negative for dysuria, frequency and urgency.  ?All other systems reviewed and are negative. ? ? ? ? ? ?Past Medical History:  ?Diagnosis Date  ? Diabetes mellitus without complication (HCC)   ? ? ? ?History reviewed. No pertinent surgical history. ? ? ? ? ?Social History:  reports that he has never smoked. He has never used smokeless tobacco. He reports that he does not drink alcohol and does not use drugs. ? ?Allergies  ?Allergen Reactions  ? Azithromycin Other (See Comments)  ?  hyperactivity  ? Penicillins Swelling  ? ? ?Family History  ?Problem Relation Age of Onset  ? Healthy Mother   ? Healthy Father   ? Diabetes type II Other   ? Diabetes type I  Other   ? Coronary artery disease Other   ? ? ?Prior to Admission medications   ?Medication Sig Start Date End Date Taking? Authorizing Provider  ?GLUCAGON HCL IJ Inject 1 Dose as directed as needed (hypoglycemia).   Yes [provider]  ?insulin aspart (NOVOLOG) 100 UNIT/ML FlexPen Inject 100 Units into the skin See admin instructions. Uses in insulin pump, 100 units per day max daily dose. 11/13/20  Yes [provider]  ?Multiple Vitamin (MULTIVITAMIN) tablet Take 1 tablet by mouth daily.   Yes [provider]  ? ? ?Physical Exam: ?Vitals:  ? 12/18/21 1300 12/18/21 1400 12/18/21 1500 12/18/21 1600  ?BP: 139/68 (!) 139/57 133/65 (!) 115/40  ?Pulse: 75 98 (!) 111 (!) 124  ?Resp: (!) 24 (!) 22 (!) 23 20  ?Temp:    98.2 ?F (36.8 ?C)  ?TempSrc:    Oral  ?SpO2: 100% 97% 94% 98%  ?Weight:    85.5 kg  ?Height:    5\' 9"  (1.753 m)  ? ?Adult male, lying in bed, appears uncomfortable, tired, a little glassy eyed. ?Anicteric, conjunctival pink, lids and lashes normal.  No nasal deformity, discharge, or epistaxis.  Oropharynx tacky dry, no oral lesions, dentition in good repair ?Heart rate tachycardic, rhythm regular, no murmurs, peripheral pulses normal, no JVD no edema ?Respiratory rate increased, no rales or wheezes in posterior lung fields ?Abdomen without tenderness palpation or guarding, no  ascites or distention ?Attention somewhat distracted, affect blunted, judgment insight appear normal, oriented to person, place, time, and situation ?Moves all extremities with normal strength and coordination, speech fluent ? ? ? ? ? ? ?Data Reviewed: ?I have Reviewed nursing notes, Vitals, and Lab results and outside records from [facility]. Pertinent lab results acidosis, leukocytosis, normal LFTs, hyperglycemia, urinalysis with glucose and ketones, no white cells or red cells or bacteria ?I have ordered test including repeat basic metabolic panel and beta hydroxybutyrate ?I have independently visualized and  interpreted imaging chest x-ray which showed no focal airspace disease or opacity. ?I have independently visualized and interpreted EKG which showed EKG: Sinus bradycardia, normal QT interval. ?I have discussed pt's care plan and test results with Dr. Adela Lank from the emergency room. ? ? ?Assessment and Plan: ?* Diabetic ketoacidosis without coma associated with type 1 diabetes mellitus (HCC) ?- Stop insulin pump ?- Give additional 1L IVF ?- Start insulin infusion ?- BMP q4hrs ?- Check BHOB ?- Continue IV fluids ?- Check TSH and A1c  ? ? ? ? ? ? ? ? Advance Care Planning:   Code Status: Full Code  ? ?Consults: None ? ?Family Communication: Mother at the bedside ? ?Severity of Illness: ?The appropriate patient status for this patient is OBSERVATION. Observation status is judged to be reasonable and necessary in order to provide the required intensity of service to ensure the patient's safety. The patient's presenting symptoms, physical exam findings, and initial radiographic and laboratory data in the context of their medical condition is felt to place them at decreased risk for further clinical deterioration. Furthermore, it is anticipated that the patient will be medically stable for discharge from the hospital within 2 midnights of admission.  ? ?Author: ?Alberteen Sam, MD ?12/18/2021 4:20 PM ? ?For on call review [email or web address].  ?

## 2021-12-18 NOTE — Discharge Instructions (Signed)
?  Please report to ED at [facility] ? ?[address] ?[place], [place] ? ? ?

## 2021-12-18 NOTE — ED Notes (Signed)
Pt c/o n/v and epigastric pain starting this morning.  Sts pain started after lying down following emesis.  ?

## 2021-12-18 NOTE — ED Notes (Signed)
ED Provider at bedside. 

## 2021-12-19 ENCOUNTER — Encounter (HOSPITAL_COMMUNITY): Payer: Self-pay

## 2021-12-19 DIAGNOSIS — E1010 Type 1 diabetes mellitus with ketoacidosis without coma: Secondary | ICD-10-CM | POA: Diagnosis not present

## 2021-12-19 LAB — BASIC METABOLIC PANEL
Anion gap: 7 (ref 5–15)
Anion gap: 7 (ref 5–15)
Anion gap: 6 (ref 5–15)
BUN: 20 mg/dL (ref 6–20)
BUN: 19 mg/dL (ref 6–20)
BUN: 22 mg/dL — ABNORMAL HIGH (ref 6–20)
CO2: 27 mmol/L (ref 22–32)
CO2: 25 mmol/L (ref 22–32)
CO2: 28 mmol/L (ref 22–32)
Calcium: 8.6 mg/dL — ABNORMAL LOW (ref 8.9–10.3)
Calcium: 8.5 mg/dL — ABNORMAL LOW (ref 8.9–10.3)
Calcium: 8.8 mg/dL — ABNORMAL LOW (ref 8.9–10.3)
Chloride: 104 mmol/L (ref 98–111)
Chloride: 101 mmol/L (ref 98–111)
Chloride: 96 mmol/L — ABNORMAL LOW (ref 98–111)
Creatinine, Ser: 0.86 mg/dL (ref 0.61–1.24)
Creatinine, Ser: 0.77 mg/dL (ref 0.61–1.24)
Creatinine, Ser: 0.84 mg/dL (ref 0.61–1.24)
GFR, Estimated: >60 mL/min (ref >60)
GFR, Estimated: >60 mL/min (ref >60)
GFR, Estimated: >60 mL/min (ref >60)
Glucose, Bld: 153 mg/dL — ABNORMAL HIGH (ref 70–99)
Glucose, Bld: 270 mg/dL — ABNORMAL HIGH (ref 70–99)
Glucose, Bld: 419 mg/dL — ABNORMAL HIGH (ref 70–99)
Potassium: 3.8 mmol/L (ref 3.5–5.1)
Potassium: 4.4 mmol/L (ref 3.5–5.1)
Potassium: 4.7 mmol/L (ref 3.5–5.1)
Sodium: 138 mmol/L (ref 135–145)
Sodium: 133 mmol/L — ABNORMAL LOW (ref 135–145)
Sodium: 130 mmol/L — ABNORMAL LOW (ref 135–145)

## 2021-12-19 LAB — GLUCOSE, CAPILLARY
Glucose-Capillary: 156 mg/dL — ABNORMAL HIGH (ref 70–99)
Glucose-Capillary: 169 mg/dL — ABNORMAL HIGH (ref 70–99)
Glucose-Capillary: 186 mg/dL — ABNORMAL HIGH (ref 70–99)
Glucose-Capillary: 243 mg/dL — ABNORMAL HIGH (ref 70–99)
Glucose-Capillary: 385 mg/dL — ABNORMAL HIGH (ref 70–99)

## 2021-12-19 LAB — BETA-HYDROXYBUTYRIC ACID: Beta-Hydroxybutyric Acid: 0.08 mmol/L (ref 0.05–0.27)

## 2021-12-19 MED ORDER — INSULIN ASPART 100 UNIT/ML IJ SOLN
0.0000 [IU] | Freq: Three times a day (TID) | INTRAMUSCULAR | Status: DC
  Administered 2021-12-19: 5 [IU] via SUBCUTANEOUS
  Administered 2021-12-19: 15 [IU] via SUBCUTANEOUS

## 2021-12-19 MED ORDER — INSULIN ASPART 100 UNIT/ML IJ SOLN: 0.0000 [IU] | Freq: Every day | INTRAMUSCULAR | Status: DC

## 2021-12-19 MED ORDER — LACTATED RINGERS IV SOLN: INTRAVENOUS | Status: DC

## 2021-12-19 MED ORDER — INSULIN ASPART 100 UNIT/ML IJ SOLN
7.0000 [IU] | Freq: Once | INTRAMUSCULAR | Status: AC
  Administered 2021-12-19: 7 [IU] via SUBCUTANEOUS

## 2021-12-19 MED ORDER — INSULIN GLARGINE-YFGN 100 UNIT/ML ~~LOC~~ SOLN
5.0000 [IU] | Freq: Once | SUBCUTANEOUS | Status: AC
  Administered 2021-12-19: 5 [IU] via SUBCUTANEOUS
  Filled 2021-12-19: qty 0.05

## 2021-12-19 NOTE — Progress Notes (Signed)
?  Transition of Care (TOC) Screening Note ? ? ?Patient Details  ?Name: Samuel Perez ?Date of Birth: Jan 23, 2001 ? ? ?Transition of Care (TOC) CM/SW Contact:    ?Malena Timpone, LCSW ?Phone Number: ?12/19/2021, 9:49 AM ? ? ? ?Transition of Care Department [facility]) has reviewed patient and no TOC needs have been identified at this time. We will continue to monitor patient advancement through interdisciplinary progression rounds. If new patient transition needs arise, please place a TOC consult. ? ? ?

## 2021-12-19 NOTE — Progress Notes (Signed)
Inpatient Diabetes Program Recommendations ? ?AACE/ADA: New Consensus Statement on Inpatient Glycemic Control (2015) ? ?Target Ranges:  Prepandial:   less than 140 mg/dL ?     Peak postprandial:   less than 180 mg/dL (1-2 hours) ?     Critically ill patients:  140 - 180 mg/dL  ? ?Lab Results  ?Component Value Date  ? GLUCAP 385 (H) 12/19/2021  ? ? ?Review of Glycemic Control ? ?Diabetes history: DM1 ?Outpatient Diabetes medications: Tandum pump ?Current orders for Inpatient glycemic control: Novolog 0-15 units TID with meals and 0-5 HS ? ?Endo is in [facility] [place] ? ?Pump settings: ?Basal - 1.6/H ?Bolus 1:7 CHO ratio ?CF - 25 with target of 125 mg/dL ? ?Long discussion with pt and Mother about DKA, need for insulin and fluids, electrolytes. Pt sees Endo every 3 months and controls blood sugars very well, according to Mom. ? ?Inpatient Diabetes Program Recommendations:   ? ?Pt to complete Quality Control check of pump with pump company prior to restarting. ? ?Novolog 7 units given for breakfast meal (1:7 CHO ratio) for 49 grams CHO. ?Received Semglee 5 units at 0315. ?May need to give Semglee 15 units if discharge is delayed until later this afternoon ?With Type 1 DM, will need Novolog meal coverage (1:7 CHO ratio) ? ?Answered questions. To f/u with Endo. ? ?Thank you. ?Ailene Ards, RD, LDN, CDE ?Inpatient Diabetes Coordinator ?[phone]  ? ? ? ? ? ? ? ?

## 2021-12-19 NOTE — Discharge Summary (Signed)
. ?Physician Discharge Summary ?  ?Patient: Samuel Perez MRN: [id number] DOB: 2001-06-14  ?Admit date:     12/18/2021  ?Discharge date: 12/19/21  ?Discharge Physician: Rhetta Mura  ? ?PCP: Lance Bosch, NP  ? ?Recommendations at discharge:  ? ?Please continue to use insulin pump as indicated and please follow-up with your primary endocrinologist in the outpatient setting ?Get Chem-12 in about 1 week ? ?Discharge Diagnoses: ? ?DKA-mild on admission ? ? ?This is a 21 year old Caucasian male who was admitted to the hospital on 3/14-he has had diabetes since age of 7-he has no other chronic medical issues and gets routine care at his endocrinologist ?About 6 months ago he started using a Tandum pump for his diabetes mellitus and has had no issues with that and his control has been good-it appears that he came to the emergency room with severe chest and epigastric pain and his CBG was high he was also having emesis and chest pain ? ?The patient's bicarb was 19 his anion gap was 21 UA had ketones and glucose and glucose was 476 ? ?He was admitted overnight and kept on the insulin gtt. ?Diabetic coordinator saw the patient in consult-made recommendations-confirmed that the pump is working-patient was discharged home in a stable state as his glycemia was better controlled and he has supplies in case of pump failure and is aware of how to use insulin ? ? ? ? ?  ? ? ?Consultants:  ?Procedures performed:   ?Disposition: Home ?Diet recommendation:  ?Discharge Diet Orders (From admission, onward)  ? ?  Start     Ordered  ? 12/19/21 0000  Diet - low sodium heart healthy       ? 12/19/21 1100  ? ?  ?  ? ?  ? ?Carb modified diet ?DISCHARGE MEDICATION: ?Allergies as of 12/19/2021   ? ?   Reactions  ? Azithromycin Other (See Comments)  ? hyperactivity  ? Penicillins Swelling  ? ?  ? ?  ?Medication List  ?  ? ?TAKE these medications   ? ?GLUCAGON HCL IJ ?Inject 1 Dose as directed as needed (hypoglycemia). ?  ?insulin aspart 100  UNIT/ML FlexPen ?Commonly known as: NOVOLOG ?Inject 100 Units into the skin See admin instructions. Uses in insulin pump, 100 units per day max daily dose. ?  ?multivitamin tablet ?Take 1 tablet by mouth daily. ?  ? ?  ? ? ?Discharge Exam: ?Filed Weights  ? 12/18/21 1122 12/18/21 1600  ?Weight: 86.2 kg 85.5 kg  ? ?Awake coherent no distress no icterus no pallor ?Neck soft supple ?S1-S2 no murmur ?ROM intact no focal deficit ?Chest clear no added sound no rales no rhonchi ?Neurologically intact ?Psych euthymic ? ?Condition at discharge: fair ? ?The results of significant diagnostics from this hospitalization (including imaging, microbiology, ancillary and laboratory) are listed below for reference.  ? ?Imaging Studies: ?DG Chest Port 1 View ? ?Result Date: 12/18/2021 ?CLINICAL DATA:  Chest and epigastric pain EXAM: PORTABLE CHEST 1 VIEW COMPARISON:  None. FINDINGS: Normal heart size. Normal mediastinal contour. No pneumothorax. No pleural effusion. Lungs appear clear, with no acute consolidative airspace disease and no pulmonary edema. IMPRESSION: No active disease. Electronically Signed   By: Delbert Phenix M.D.   On: 12/18/2021 11:54   ? ?Microbiology: ?Results for orders placed or performed during the hospital encounter of 12/18/21  ?MRSA Next Gen by PCR, Nasal     Status: None  ? Collection Time: 12/18/21  3:48 PM  ? Specimen: Nasal Mucosa;  Nasal Swab  ?Result Value Ref Range Status  ? MRSA by PCR Next Gen NOT DETECTED NOT DETECTED Final  ?  Comment: (NOTE) ?The GeneXpert MRSA Assay (FDA approved for NASAL specimens only), ?is one component of a comprehensive MRSA colonization surveillance ?program. It is not intended to diagnose MRSA infection nor to guide ?or monitor treatment for MRSA infections. ?Test performance is not FDA approved in patients less than 2 years ?old. ?Performed at [facility], [address] ?[place], [place] [address] ?  ? ? ?Labs: ?CBC: ?Recent Labs  ?Lab 12/18/21 ?1200   ?WBC 16.3*  ?HGB 15.5  ?HCT 47.8  ?MCV 86.6  ?PLT 176  ? ?Basic Metabolic Panel: ?Recent Labs  ?Lab 12/18/21 ?1200 12/18/21 ?1811 12/18/21 ?2158 12/19/21 ?0242 12/19/21 ?6045  ?NA 134* 136 134* 138 133*  ?K 5.1 4.3 4.2 3.8 4.4  ?CL 94* 102 104 104 101  ?CO2 19* 22 24 27 25   ?GLUCOSE 476* 259* 212* 153* 270*  ?BUN 26* 24* 21* 20 19  ?CREATININE 1.08 1.08 0.72 0.86 0.77  ?CALCIUM 9.9 9.6 9.1 8.6* 8.5*  ? ?Liver Function Tests: ?Recent Labs  ?Lab 12/18/21 ?1200  ?AST 44*  ?ALT 41  ?ALKPHOS 62  ?BILITOT 1.2  ?PROT 8.4*  ?ALBUMIN 4.9  ? ?CBG: ?Recent Labs  ?Lab 12/19/21 ?12/21/21 12/19/21 ?0130 12/19/21 ?0234 12/19/21 ?0756 12/19/21 ?1023  ?GLUCAP 186* 169* 156* 243* 385*  ? ? ?Discharge time spent: greater than 30 minutes. ? ?Signed: ?12/21/21, MD ?[facility] ?12/19/2021 ?

## 2021-12-19 NOTE — Progress Notes (Signed)
AVS instructions reviewed with patient per orders; all questions answered at this time by RN. Lunchtime CBG was obtained and covered by RN per diabetes coordinator orders, since pt will be restarting insulin pump at home. All patient belongings returned. All PIVs removed by RN. Education and care plan completed. Patient was taken downstairs to main entrance by RN, where patient's mother was waiting to drive him home.  ?

## 2021-12-20 LAB — HEMOGLOBIN A1C
Hgb A1c MFr Bld: 6.4 % — ABNORMAL HIGH (ref 4.8–5.6)
Mean Plasma Glucose: 137 mg/dL

## 2023-01-19 IMAGING — DX DG CHEST 1V PORT
2 series · 2 of 2 positions shown · non-contrast
Comparison: None.

CLINICAL DATA: Chest and epigastric pain

EXAM:
PORTABLE CHEST 1 VIEW

[chest ap (1 of 2)]
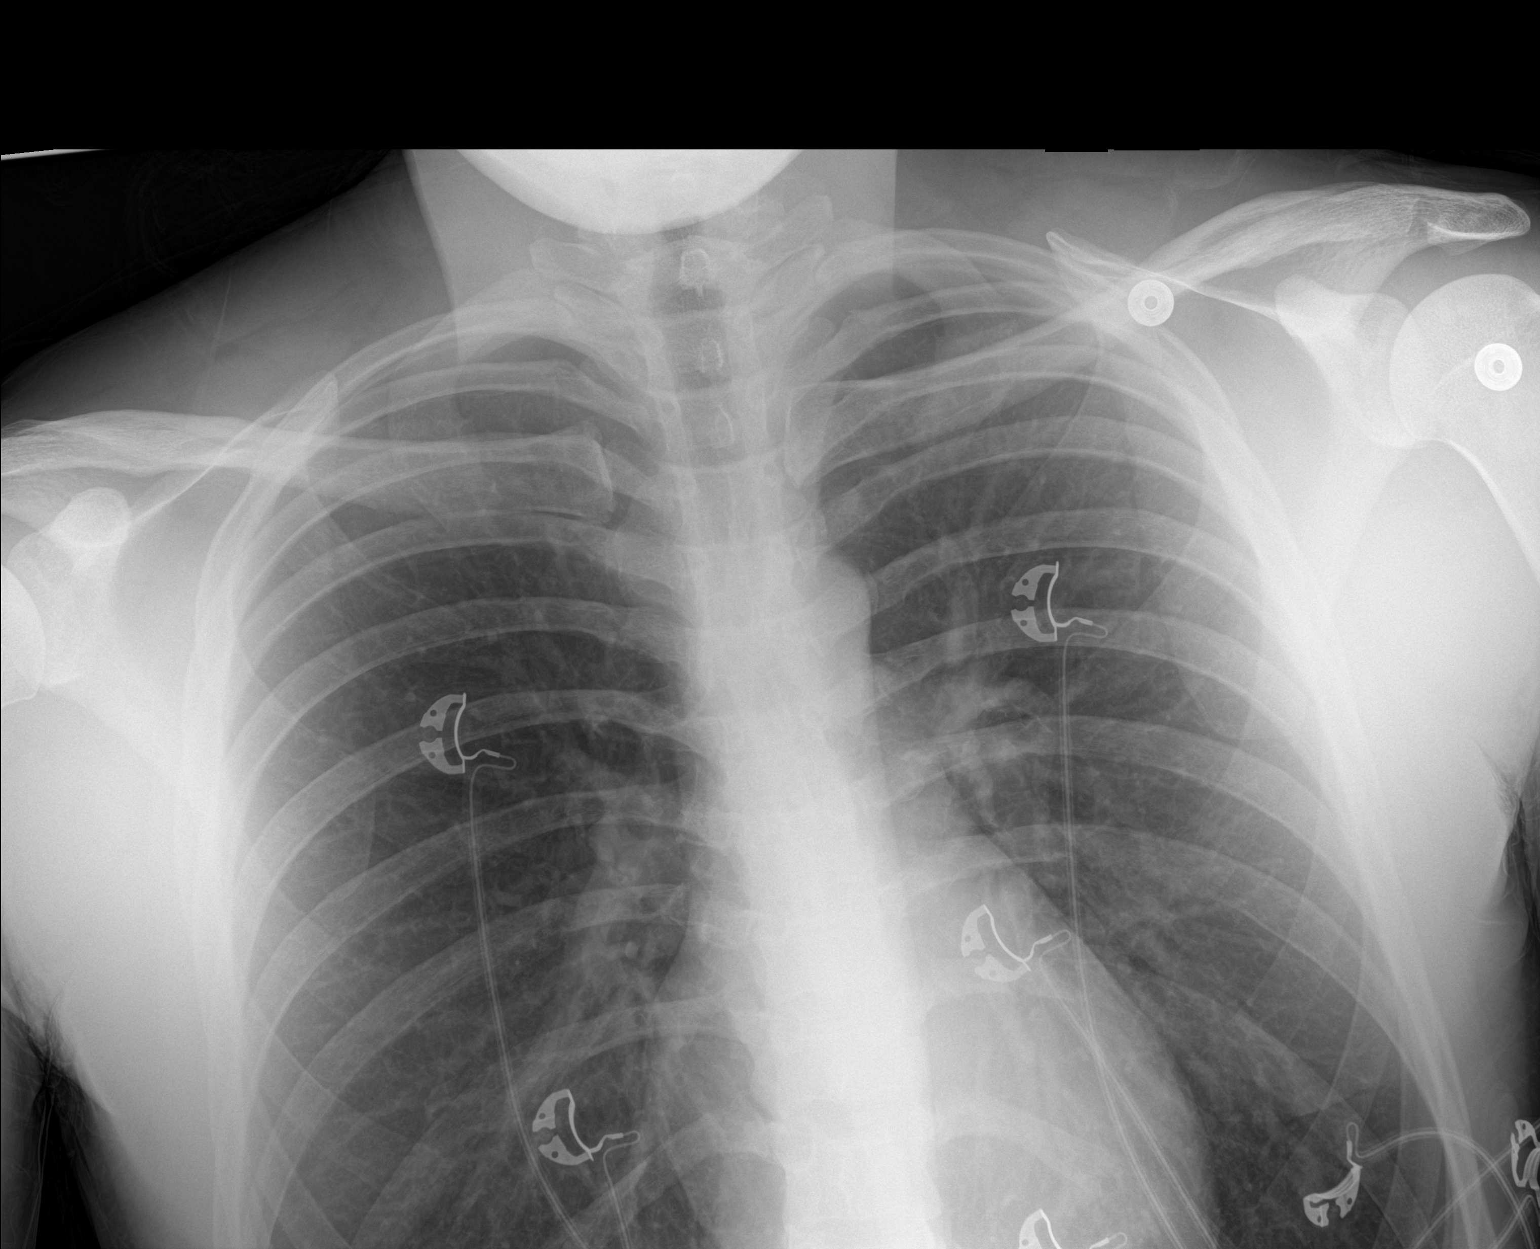

[chest ap (2 of 2)]
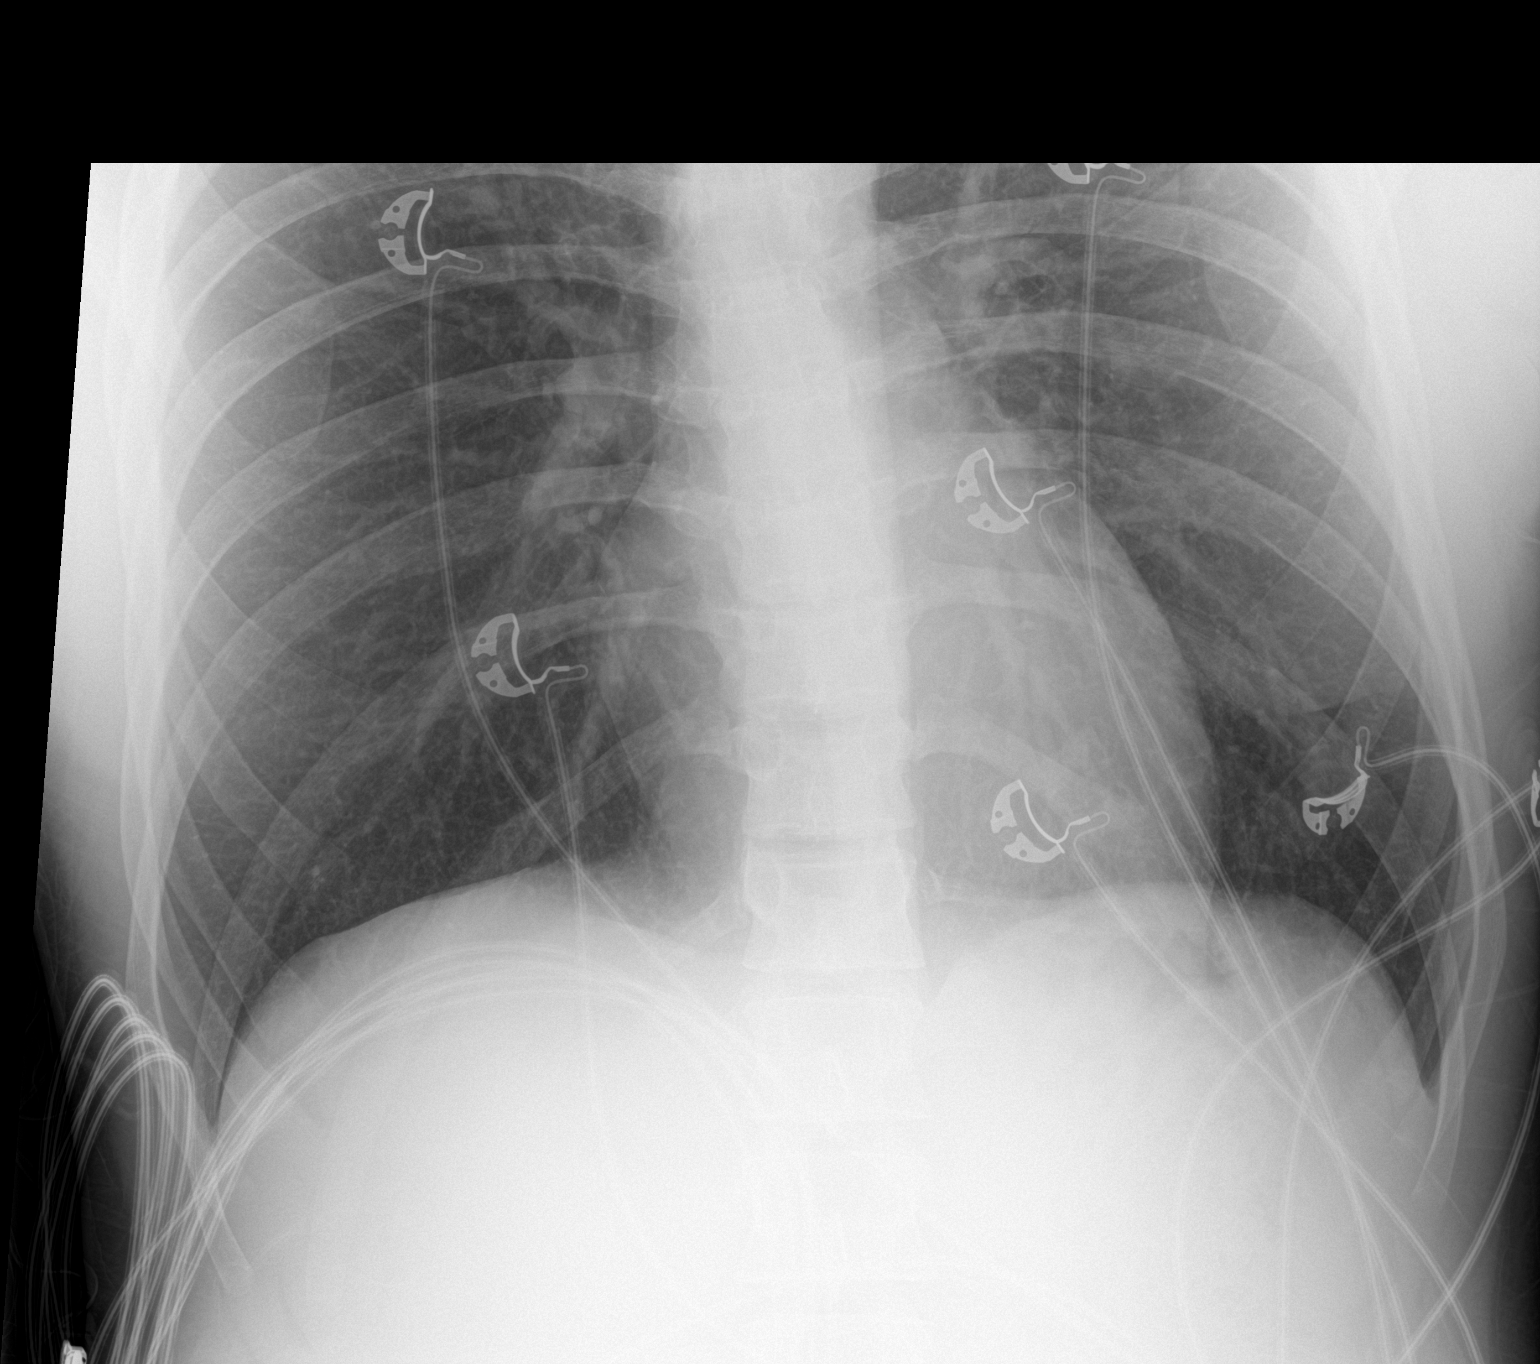

[2 of 2 positions shown; findings below may reference images not displayed]

FINDINGS: Normal heart size. Normal mediastinal contour. No pneumothorax. No
pleural effusion. Lungs appear clear, with no acute consolidative
airspace disease and no pulmonary edema.
IMPRESSION: No active disease.

## 2024-01-19 ENCOUNTER — Ambulatory Visit: Payer: Self-pay | Admitting: Family Medicine

## 2024-01-19 ENCOUNTER — Encounter: Payer: Self-pay | Admitting: Family Medicine

## 2024-01-19 VITALS — BP 126/82 | HR 74 | Temp 98.0°F | Ht 69.5 in | Wt 206.0 lb

## 2024-01-19 DIAGNOSIS — Z0000 Encounter for general adult medical examination without abnormal findings: Secondary | ICD-10-CM | POA: Diagnosis not present

## 2024-01-19 DIAGNOSIS — E1010 Type 1 diabetes mellitus with ketoacidosis without coma: Secondary | ICD-10-CM

## 2024-01-19 LAB — POCT GLYCOSYLATED HEMOGLOBIN (HGB A1C): Hemoglobin A1C: 5.8 % — AB (ref 4.0–5.6)

## 2024-01-19 MED ORDER — FREESTYLE LIBRE 3 PLUS SENSOR MISC: 11 refills | Status: DC

## 2024-01-19 MED ORDER — LANTUS SOLOSTAR 100 UNIT/ML ~~LOC~~ SOPN: 33.0000 [IU] | PEN_INJECTOR | Freq: Every day | SUBCUTANEOUS | 3 refills | Status: AC

## 2024-01-19 MED ORDER — INSULIN ASPART 100 UNIT/ML FLEXPEN: 100.0000 [IU] | PEN_INJECTOR | SUBCUTANEOUS | 3 refills | Status: AC

## 2024-01-19 NOTE — Progress Notes (Unsigned)
 Assessment/Plan:   Problem List Items Addressed This Visit   None   Assessment and Plan Assessment & Plan Type 1 Diabetes Mellitus A 23 year old male with Type 1 Diabetes Mellitus, managed with insulin therapy. Hospitalized for DKA a year ago, no other complications since. Current HbA1c is 5.7%, indicating excellent glycemic control. Uses Lantus (33 units daily) and NovoLog (1 unit per 7 grams of carbohydrates, approximately 60 units daily). Not using CGM due to insurance issues, currently using fingerstick glucose monitoring. Discussed the importance of CGM for better management and the plan to obtain prior authorization for a Freestyle CGM. Emphasized the need for regular monitoring to prevent future DKA episodes. - Refill Lantus 33 units daily. - Refill NovoLog with a prescription for 60 units daily, providing a cushion for potential increased usage. - Attempt to obtain prior authorization for a Freestyle CGM to improve glucose monitoring and management. - Refer to endocrinology for specialized diabetes management. - Refer for an eye exam to screen for diabetic retinopathy. - Order baseline lab work including kidney function, lipids, and thyroid function tests.  General Health Maintenance General health maintenance is crucial to monitor for potential complications associated with diabetes, such as cardiovascular disease. Has not had fasting blood work in about a year. Discussed the increased risk for heart disease and the importance of regular screenings. - Order fasting blood work to assess baseline kidney function, lipids, and thyroid function. - Provide anticipatory guidance on the importance of regular screenings and maintaining good glycemic control to prevent complications.  Follow-up Establishing care and requires follow-up to ensure continuity of care and management of diabetes. Discussed the importance of setting up MyChart for access to medical records and ensuring no lag in  medication refills. - Schedule follow-up appointment in 1-2 weeks to review lab results and discuss diabetes management. - Provide printout of visit summary and ensure MyChart is set up for access to medical records.      There are no discontinued medications.  No follow-ups on file.    Subjective:   Encounter date: 01/19/2024  Zenith Lamphier is a 23 y.o. male who has Diabetic ketoacidosis without coma associated with type 1 diabetes mellitus (HCC) on their problem list..   He  has a past medical history of Diabetes mellitus without complication (HCC).Marland Kitchen   He presents with chief complaint of Establish Care (Type 1 DM, Endo and DM eye exam referral.) .   Discussed the use of AI scribe software for clinical note transcription with the patient, who gave verbal consent to proceed.  History of Present Illness Malaquias Lenker is a 23 year old male with type 1 diabetes who presents to establish care.  He has a history of type 1 diabetes and was hospitalized for diabetic ketoacidosis (DKA) approximately one year ago. Since then, he has not experienced any further complications such as kidney issues or additional hospitalizations. His most recent hemoglobin A1c is 5.7%.  He manages his diabetes with insulin therapy, taking 33 units of Lantus daily and using NovoLog, administering one unit for every seven grams of carbohydrates consumed, approximately four times a day. He estimates using about 60 units of NovoLog daily. He previously used an insulin pump but is not currently using one. He also has glucagon available for emergencies.  His current blood glucose monitoring system, [employer], is not covered by his insurance, [employer], which only covers [employer] system. He is not using a continuous glucose monitor (CGM) due to insurance coverage issues  and is currently performing fingerstick blood glucose checks.  Socially, he is taking part-time classes at [employer] and plans to transfer to  a different university in the fall to study music. He plays guitar, bass, and sings, having started playing guitar in middle school.     ROS  History reviewed. No pertinent surgical history.  Outpatient Medications Prior to Visit  Medication Sig Dispense Refill   BD PEN NEEDLE NANO 2ND GEN 32G X 4 MM MISC USE AS DIRECTED WITH INSULIN PEN UP TO 6 TIMES PER DAY.     GLUCAGON HCL IJ Inject 1 Dose as directed as needed (hypoglycemia).     insulin aspart (NOVOLOG) 100 UNIT/ML FlexPen Inject 100 Units into the skin See admin instructions. Uses in insulin pump, 100 units per day max daily dose.     Lancets (ONETOUCH DELICA PLUS LANCET33G) MISC USE AS DIRECTED TO CHECK BLOOD SUGAR UP TO 10 TIMES A DAY     LANTUS SOLOSTAR 100 UNIT/ML Solostar Pen Inject 100 Units into the skin as directed.     Multiple Vitamin (MULTIVITAMIN) tablet Take 1 tablet by mouth daily.     No facility-administered medications prior to visit.    Family History  Problem Relation Age of Onset   Healthy Mother    Healthy Father    Diabetes type II Other    Diabetes type I Other    Coronary artery disease Other     Social History   Socioeconomic History   Marital status: Single    Spouse name: Not on file   Number of children: Not on file   Years of education: Not on file   Highest education level: Not on file  Occupational History   Not on file  Tobacco Use   Smoking status: Never    Passive exposure: Never   Smokeless tobacco: Never  Vaping Use   Vaping status: Never Used  Substance and Sexual Activity   Alcohol use: Never   Drug use: Never   Sexual activity: Not on file  Other Topics Concern   Not on file  Social History Narrative   Works as a Designer, fashion/clothing at [employer]   Social Drivers of Corporate investment banker Strain: Not on file  Food Insecurity: Not on file  Transportation Needs: Not on file  Physical Activity: Not on file  Stress: Not on file  Social Connections: Not on file   Intimate Partner Violence: Not on file                                                                                                  Objective:  Physical Exam: BP 126/82   Pulse 74   Temp 98 F (36.7 C) (Temporal)   Wt 206 lb (93.4 kg)   SpO2 98%   BMI 30.42 kg/m    Physical Exam    Physical Exam  No results found.  No results found for this or any previous visit (from the past 2160 hours).      Garner Nash, MD, MS

## 2024-01-19 NOTE — Patient Instructions (Signed)
 VISIT SUMMARY:  Samuel Perez, a 23 year old male with Type 1 Diabetes Mellitus, visited to establish care. He has a history of diabetic ketoacidosis (DKA) but has maintained excellent glycemic control with a recent hemoglobin A1c of 5.7%. He manages his diabetes with insulin therapy, using Lantus and NovoLog, and performs fingerstick blood glucose checks. Insurance issues have prevented him from using a continuous glucose monitor (CGM).  YOUR PLAN:  -TYPE 1 DIABETES MELLITUS: Type 1 Diabetes Mellitus is a condition where your body does not produce insulin, requiring you to manage it with insulin therapy. Your current regimen includes 33 units of Lantus daily and approximately 60 units of NovoLog daily. We discussed the importance of continuous glucose monitoring (CGM) for better management and will attempt to obtain prior authorization for a Freestyle CGM. You will also be referred to an endocrinologist for specialized diabetes management and for an eye exam to screen for diabetic retinopathy. Additionally, we will order baseline lab work to check your kidney function, lipids, and thyroid function.  -GENERAL HEALTH MAINTENANCE: Maintaining your general health is crucial, especially with diabetes, as it increases the risk for complications like heart disease. We will order fasting blood work to assess your kidney function, lipids, and thyroid function. Regular screenings and maintaining good glycemic control are important to prevent complications.  INSTRUCTIONS:  Please schedule a follow-up appointment in 1-2 weeks to review your lab results and discuss your diabetes management. Ensure that you set up MyChart for access to your medical records and to avoid any delays in medication refills.

## 2024-01-28 ENCOUNTER — Other Ambulatory Visit (INDEPENDENT_AMBULATORY_CARE_PROVIDER_SITE_OTHER)

## 2024-01-28 DIAGNOSIS — E1010 Type 1 diabetes mellitus with ketoacidosis without coma: Secondary | ICD-10-CM

## 2024-01-28 LAB — CBC WITH DIFFERENTIAL/PLATELET
Basophils Absolute: 0 10*3/uL (ref 0.0–0.1)
Basophils Relative: 0.6 % (ref 0.0–3.0)
Eosinophils Absolute: 0.1 10*3/uL (ref 0.0–0.7)
Eosinophils Relative: 2.6 % (ref 0.0–5.0)
HCT: 46.9 % (ref 39.0–52.0)
Hemoglobin: 15.5 g/dL (ref 13.0–17.0)
Lymphocytes Relative: 45.7 % (ref 12.0–46.0)
Lymphs Abs: 2.1 10*3/uL (ref 0.7–4.0)
MCHC: 33 g/dL (ref 30.0–36.0)
MCV: 85.8 fl (ref 78.0–100.0)
Monocytes Absolute: 0.4 10*3/uL (ref 0.1–1.0)
Monocytes Relative: 8.8 % (ref 3.0–12.0)
Neutro Abs: 1.9 10*3/uL (ref 1.4–7.7)
Neutrophils Relative %: 42.3 % — ABNORMAL LOW (ref 43.0–77.0)
Platelets: 152 10*3/uL (ref 150.0–400.0)
RBC: 5.47 Mil/uL (ref 4.22–5.81)
RDW: 13.3 % (ref 11.5–15.5)
WBC: 4.5 10*3/uL (ref 4.0–10.5)

## 2024-01-28 LAB — COMPREHENSIVE METABOLIC PANEL WITH GFR
ALT: 27 U/L (ref 0–53)
AST: 36 U/L (ref 0–37)
Albumin: 4.8 g/dL (ref 3.5–5.2)
Alkaline Phosphatase: 58 U/L (ref 39–117)
BUN: 15 mg/dL (ref 6–23)
CO2: 34 meq/L — ABNORMAL HIGH (ref 19–32)
Calcium: 9.9 mg/dL (ref 8.4–10.5)
Chloride: 99 meq/L (ref 96–112)
Creatinine, Ser: 1.21 mg/dL (ref 0.40–1.50)
GFR: 85.71 mL/min (ref >60.00)
Glucose, Bld: 76 mg/dL (ref 70–99)
Potassium: 4.1 meq/L (ref 3.5–5.1)
Sodium: 139 meq/L (ref 135–145)
Total Bilirubin: 0.7 mg/dL (ref 0.2–1.2)
Total Protein: 7.6 g/dL (ref 6.0–8.3)

## 2024-01-28 LAB — LIPID PANEL
Cholesterol: 153 mg/dL (ref 0–200)
HDL: 53.4 mg/dL (ref >39.00)
LDL Cholesterol: 85 mg/dL (ref 0–99)
NonHDL: 99.33
Total CHOL/HDL Ratio: 3
Triglycerides: 71 mg/dL (ref 0.0–149.0)
VLDL: 14.2 mg/dL (ref 0.0–40.0)

## 2024-01-28 LAB — MICROALBUMIN / CREATININE URINE RATIO
Creatinine,U: 231.5 mg/dL
Microalb Creat Ratio: 4.4 mg/g (ref 0.0–30.0)
Microalb, Ur: 1 mg/dL (ref 0.0–1.9)

## 2024-01-29 LAB — URINALYSIS W MICROSCOPIC + REFLEX CULTURE
Bacteria, UA: NONE SEEN /HPF
Bilirubin Urine: NEGATIVE
Glucose, UA: NEGATIVE
Hgb urine dipstick: NEGATIVE
Hyaline Cast: NONE SEEN /LPF
Ketones, ur: NEGATIVE
Leukocyte Esterase: NEGATIVE
Nitrites, Initial: NEGATIVE
Protein, ur: NEGATIVE
RBC / HPF: NONE SEEN /HPF (ref 0–2)
Specific Gravity, Urine: 1.021 (ref 1.001–1.035)
Squamous Epithelial / HPF: NONE SEEN /HPF (ref <=5)
WBC, UA: NONE SEEN /HPF (ref 0–5)
pH: 6.5 (ref 5.0–8.0)

## 2024-01-29 LAB — NO CULTURE INDICATED

## 2024-01-30 LAB — VITAMIN D 1,25 DIHYDROXY
Vitamin D 1, 25 (OH)2 Total: 37 pg/mL (ref 18–72)
Vitamin D2 1, 25 (OH)2: <8 pg/mL
Vitamin D3 1, 25 (OH)2: 37 pg/mL

## 2024-01-30 LAB — HM DIABETES EYE EXAM

## 2024-01-30 LAB — THYROID PANEL WITH TSH
Free Thyroxine Index: 2.6 (ref 1.4–3.8)
T3 Uptake: 34 % (ref 22–35)
T4, Total: 7.6 ug/dL (ref 4.9–10.5)
TSH: 3.42 m[IU]/L (ref 0.40–4.50)

## 2024-02-02 ENCOUNTER — Encounter: Payer: Self-pay | Admitting: Family Medicine

## 2024-02-05 ENCOUNTER — Telehealth: Payer: Self-pay

## 2024-02-05 ENCOUNTER — Other Ambulatory Visit (HOSPITAL_COMMUNITY): Payer: Self-pay

## 2024-02-05 DIAGNOSIS — E1010 Type 1 diabetes mellitus with ketoacidosis without coma: Secondary | ICD-10-CM

## 2024-02-05 MED ORDER — DEXCOM G7 SENSOR MISC: 11 refills | Status: AC

## 2024-02-05 NOTE — Telephone Encounter (Signed)
 Pharmacy Patient Advocate Encounter   Received notification from Pt Calls Messages that prior authorization for Freestyle Libre 3 Plus sensor is required/requested.   Insurance verification completed.   The patient is insured through [facility] .   Per test claim:  Dexcom G6 or G7 sensors  is preferred by the insurance.  If suggested medication is appropriate, Please send in a new RX and discontinue this one. If not, please advise as to why it's not appropriate so that we may request a Prior Authorization. Please note, some preferred medications may still require a PA.  If the suggested medications have not been trialed and there are no contraindications to their use, the PA will not be submitted, as it will not be approved.

## 2024-02-05 NOTE — Telephone Encounter (Signed)
 Copied from CRM [id number]. Topic: Clinical - Prescription Issue >> Feb 05, 2024  1:23 PM Caliyah H wrote: Reason for CRM: Patient called today regarding the Freestyle Libre 3 Plus Sensor. He stated that when it was prescribed, Dr. Hildy Lowers informed him that if his insurance did not cover the device, he should reach out to [facility] for assistance. Patient is requesting guidance on next steps.   Callback Number: [phone]

## 2024-02-05 NOTE — Addendum Note (Signed)
 Addended by: Kasandra Pain B on: 02/05/2024 04:10 PM   Modules accepted: Orders

## 2024-02-05 NOTE — Telephone Encounter (Signed)
 PCP notified on separate encounter

## 2024-03-17 ENCOUNTER — Telehealth: Payer: Self-pay | Admitting: Family Medicine

## 2024-03-17 NOTE — Telephone Encounter (Signed)
 Copied from CRM [id number]. Topic: Appointments - Appointment Scheduling >> Mar 17, 2024  1:44 PM Juleen Oakland F wrote: Patient called to schedule tb test for school preferable June 17th after 1pm, I advised him to confirm if he needs the skin or blood tb test. Please call patient to schedule at [phone] (M)

## 2024-03-17 NOTE — Telephone Encounter (Signed)
 I spoke with pt, he needs a tb skin test for school. I scheduled appt for Tuesday 03/23/24. He Is aware has to be read on Thursday.   Is this Ok?

## 2024-03-23 ENCOUNTER — Ambulatory Visit (INDEPENDENT_AMBULATORY_CARE_PROVIDER_SITE_OTHER)

## 2024-03-23 DIAGNOSIS — Z111 Encounter for screening for respiratory tuberculosis: Secondary | ICD-10-CM

## 2024-03-23 NOTE — Progress Notes (Signed)
 PPD Placement note Samuel Perez, 23 y.o. 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 Yes Is patient taking any oral or IV steroid medication now or have they taken it in the last month? no Has the patient ever received the BCG vaccine?: No Has the patient been in recent contact with anyone known or suspected of having active TB disease?: no     P:  PPD placed on 03/23/2024 on left forearm by Rockford Churches, CMA.  Patient advised to return for reading within 48-72 hours. Appointment made for 03/25/24 for PPD Reading.

## 2024-03-25 ENCOUNTER — Ambulatory Visit

## 2024-03-25 LAB — TB SKIN TEST
Induration: 0 mm
TB Skin Test: NEGATIVE

## 2024-03-25 NOTE — Progress Notes (Signed)
 PPD Reading Note  PPD read and results entered in EpicCare. Result: 0 mm induration. Interpretation: Patient came in for a PPD reading. Left Forearm was read by Rockford Churches, CMA and was negative, 0mm. Paperwork was completed and copied.   Allergic reaction: no
